# Patient Record
Sex: Male | Born: 1940 | Race: Black or African American | Hispanic: No | Marital: Married
Health system: Southern US, Community
[De-identification: ages and names within clinical notes are randomized; demographics above are authoritative.]

---

## 2008-01-22 ENCOUNTER — Ambulatory Visit: Payer: Self-pay | Admitting: Internal Medicine

## 2009-08-31 ENCOUNTER — Ambulatory Visit: Payer: Self-pay | Admitting: Urology

## 2009-12-31 ENCOUNTER — Ambulatory Visit: Payer: Self-pay | Admitting: Oncology

## 2010-01-27 ENCOUNTER — Ambulatory Visit: Payer: Self-pay | Admitting: Oncology

## 2010-01-31 ENCOUNTER — Ambulatory Visit: Payer: Self-pay | Admitting: Oncology

## 2010-02-06 ENCOUNTER — Ambulatory Visit: Payer: Self-pay | Admitting: Oncology

## 2010-02-08 ENCOUNTER — Ambulatory Visit: Payer: Self-pay | Admitting: Oncology

## 2010-02-20 ENCOUNTER — Ambulatory Visit: Payer: Self-pay | Admitting: General Surgery

## 2010-02-22 ENCOUNTER — Inpatient Hospital Stay: Payer: Self-pay | Admitting: General Surgery

## 2010-03-02 ENCOUNTER — Ambulatory Visit: Payer: Self-pay | Admitting: Oncology

## 2010-04-02 ENCOUNTER — Ambulatory Visit: Payer: Self-pay | Admitting: Oncology

## 2010-05-03 ENCOUNTER — Ambulatory Visit: Payer: Self-pay | Admitting: Oncology

## 2010-06-01 ENCOUNTER — Ambulatory Visit: Payer: Self-pay | Admitting: Oncology

## 2010-07-02 ENCOUNTER — Ambulatory Visit: Payer: Self-pay | Admitting: Oncology

## 2010-07-04 ENCOUNTER — Inpatient Hospital Stay: Payer: Self-pay | Admitting: Oncology

## 2010-07-09 ENCOUNTER — Ambulatory Visit: Payer: Self-pay | Admitting: Oncology

## 2010-08-01 ENCOUNTER — Ambulatory Visit: Payer: Self-pay | Admitting: Oncology

## 2010-09-01 ENCOUNTER — Ambulatory Visit: Payer: Self-pay | Admitting: Oncology

## 2010-09-14 ENCOUNTER — Ambulatory Visit: Payer: Self-pay | Admitting: Unknown Physician Specialty

## 2010-09-14 DIAGNOSIS — I10 Essential (primary) hypertension: Secondary | ICD-10-CM

## 2010-09-19 ENCOUNTER — Ambulatory Visit: Payer: Self-pay | Admitting: Oncology

## 2010-09-21 ENCOUNTER — Ambulatory Visit: Payer: Self-pay | Admitting: Unknown Physician Specialty

## 2010-10-01 ENCOUNTER — Ambulatory Visit: Payer: Self-pay | Admitting: Oncology

## 2010-11-01 ENCOUNTER — Ambulatory Visit: Payer: Self-pay | Admitting: Oncology

## 2010-12-02 ENCOUNTER — Ambulatory Visit: Payer: Self-pay | Admitting: Oncology

## 2011-01-01 ENCOUNTER — Ambulatory Visit: Payer: Self-pay | Admitting: Oncology

## 2011-02-01 ENCOUNTER — Ambulatory Visit: Payer: Self-pay | Admitting: Oncology

## 2011-03-03 ENCOUNTER — Ambulatory Visit: Payer: Self-pay | Admitting: Oncology

## 2011-03-20 ENCOUNTER — Emergency Department: Payer: Self-pay | Admitting: Emergency Medicine

## 2011-04-03 ENCOUNTER — Ambulatory Visit: Payer: Self-pay | Admitting: Oncology

## 2011-04-04 LAB — CBC CANCER CENTER
Basophil #: 0 x10 3/mm (ref 0.0–0.1)
Eosinophil #: 0 x10 3/mm (ref 0.0–0.7)
Lymphocyte #: 0.5 x10 3/mm — ABNORMAL LOW (ref 1.0–3.6)
MCHC: 34.5 g/dL (ref 32.0–36.0)
MCV: 84 fL (ref 80–100)
Monocyte %: 0.8 %
Neutrophil #: 3.7 x10 3/mm (ref 1.4–6.5)
Platelet: 83 x10 3/mm — ABNORMAL LOW (ref 150–440)
RBC: 3.75 10*6/uL — ABNORMAL LOW (ref 4.40–5.90)
RDW: 16.6 % — ABNORMAL HIGH (ref 11.5–14.5)
WBC: 4.2 x10 3/mm (ref 3.8–10.6)

## 2011-04-17 LAB — CBC CANCER CENTER
Basophil #: 0 x10 3/mm (ref 0.0–0.1)
Basophil %: 0.6 %
Eosinophil %: 0.7 %
HGB: 11.6 g/dL — ABNORMAL LOW (ref 13.0–18.0)
Lymphocyte #: 1.5 x10 3/mm (ref 1.0–3.6)
Lymphocyte %: 19.5 %
Monocyte %: 13.1 %
Neutrophil %: 66.1 %
Platelet: 207 x10 3/mm (ref 150–440)
RBC: 3.96 10*6/uL — ABNORMAL LOW (ref 4.40–5.90)
WBC: 7.4 x10 3/mm (ref 3.8–10.6)

## 2011-04-17 LAB — COMPREHENSIVE METABOLIC PANEL
Albumin: 3.6 g/dL (ref 3.4–5.0)
Alkaline Phosphatase: 175 U/L — ABNORMAL HIGH (ref 50–136)
Calcium, Total: 8.7 mg/dL (ref 8.5–10.1)
Creatinine: 0.87 mg/dL (ref 0.60–1.30)
EGFR (African American): >60
EGFR (Non-African Amer.): >60
Glucose: 89 mg/dL (ref 65–99)
Osmolality: 275 (ref 275–301)
Potassium: 3.6 mmol/L (ref 3.5–5.1)
SGOT(AST): 30 U/L (ref 15–37)

## 2011-04-28 ENCOUNTER — Emergency Department: Payer: Self-pay | Admitting: Emergency Medicine

## 2011-05-02 LAB — BASIC METABOLIC PANEL
Anion Gap: 11 (ref 7–16)
Calcium, Total: 9 mg/dL (ref 8.5–10.1)
Chloride: 101 mmol/L (ref 98–107)
Creatinine: 1 mg/dL (ref 0.60–1.30)
Glucose: 173 mg/dL — ABNORMAL HIGH (ref 65–99)
Osmolality: 282 (ref 275–301)
Potassium: 4 mmol/L (ref 3.5–5.1)
Sodium: 139 mmol/L (ref 136–145)

## 2011-05-02 LAB — CBC CANCER CENTER
Basophil #: 0 x10 3/mm (ref 0.0–0.1)
Basophil %: 0.6 %
Eosinophil %: 0 %
HCT: 33.5 % — ABNORMAL LOW (ref 40.0–52.0)
HGB: 11.5 g/dL — ABNORMAL LOW (ref 13.0–18.0)
Lymphocyte #: 0.6 x10 3/mm — ABNORMAL LOW (ref 1.0–3.6)
Lymphocyte %: 40.1 %
MCH: 29.3 pg (ref 26.0–34.0)
MCHC: 34.4 g/dL (ref 32.0–36.0)
MCV: 85 fL (ref 80–100)
Monocyte #: 0.2 x10 3/mm (ref 0.0–0.7)
Platelet: 209 x10 3/mm (ref 150–440)
WBC: 1.4 x10 3/mm — CL (ref 3.8–10.6)

## 2011-05-04 ENCOUNTER — Ambulatory Visit: Payer: Self-pay | Admitting: Oncology

## 2011-05-08 LAB — CBC CANCER CENTER
Bands: 13 %
Basophil #: 0 x10 3/mm (ref 0.0–0.1)
Basophil %: 0.4 %
Basophil: 1 %
Eosinophil %: 0 %
HGB: 11.1 g/dL — ABNORMAL LOW (ref 13.0–18.0)
Lymphocyte %: 21.9 %
Lymphocytes: 16 %
MCHC: 34.1 g/dL (ref 32.0–36.0)
MCV: 85 fL (ref 80–100)
Monocyte #: 1.5 x10 3/mm — ABNORMAL HIGH (ref 0.0–0.7)
Monocytes: 19 %
Neutrophil %: 57.4 %
Platelet: 222 x10 3/mm (ref 150–440)
RBC: 3.86 10*6/uL — ABNORMAL LOW (ref 4.40–5.90)
RDW: 16.1 % — ABNORMAL HIGH (ref 11.5–14.5)
Segmented Neutrophils: 44 %
WBC: 7.2 x10 3/mm (ref 3.8–10.6)

## 2011-05-16 LAB — URINALYSIS, COMPLETE
Bilirubin,UR: NEGATIVE
Blood: NEGATIVE
Glucose,UR: NEGATIVE mg/dL (ref 0–75)
Ph: 5 (ref 4.5–8.0)
Specific Gravity: 1.023 (ref 1.003–1.030)
Squamous Epithelial: <1

## 2011-05-16 LAB — BASIC METABOLIC PANEL
Anion Gap: 8 (ref 7–16)
Creatinine: 1.05 mg/dL (ref 0.60–1.30)
EGFR (Non-African Amer.): >60
Glucose: 106 mg/dL — ABNORMAL HIGH (ref 65–99)
Osmolality: 279 (ref 275–301)
Potassium: 4.1 mmol/L (ref 3.5–5.1)

## 2011-05-16 LAB — CBC CANCER CENTER
Basophil #: 0 x10 3/mm (ref 0.0–0.1)
Basophil %: 0.4 %
Eosinophil #: 0 x10 3/mm (ref 0.0–0.7)
Eosinophil %: 0.4 %
HGB: 11.1 g/dL — ABNORMAL LOW (ref 13.0–18.0)
MCH: 28.5 pg (ref 26.0–34.0)
MCHC: 33.7 g/dL (ref 32.0–36.0)
Monocyte #: 0.8 x10 3/mm — ABNORMAL HIGH (ref 0.0–0.7)
Monocyte %: 8.8 %
Neutrophil %: 76.3 %
Platelet: 224 x10 3/mm (ref 150–440)
RDW: 16.3 % — ABNORMAL HIGH (ref 11.5–14.5)
WBC: 9.6 x10 3/mm (ref 3.8–10.6)

## 2011-05-22 LAB — CULTURE, BLOOD (SINGLE)

## 2011-05-29 LAB — CREATININE, SERUM
EGFR (African American): >60
EGFR (Non-African Amer.): >60

## 2011-05-30 LAB — CBC CANCER CENTER
Basophil #: 0 x10 3/mm (ref 0.0–0.1)
Basophil %: 0.2 %
Eosinophil #: 0.1 x10 3/mm (ref 0.0–0.7)
Eosinophil %: 0.5 %
HCT: 35.2 % — ABNORMAL LOW (ref 40.0–52.0)
HGB: 11.6 g/dL — ABNORMAL LOW (ref 13.0–18.0)
Lymphocyte #: 1.3 x10 3/mm (ref 1.0–3.6)
MCV: 85 fL (ref 80–100)
Monocyte #: 0.6 x10 3/mm (ref 0.0–0.7)
Monocyte %: 4.1 %
Platelet: 233 x10 3/mm (ref 150–440)
RBC: 4.16 10*6/uL — ABNORMAL LOW (ref 4.40–5.90)
WBC: 15.5 x10 3/mm — ABNORMAL HIGH (ref 3.8–10.6)

## 2011-05-30 LAB — COMPREHENSIVE METABOLIC PANEL
Albumin: 3.2 g/dL — ABNORMAL LOW (ref 3.4–5.0)
Alkaline Phosphatase: 223 U/L — ABNORMAL HIGH (ref 50–136)
Anion Gap: 13 (ref 7–16)
BUN: 12 mg/dL (ref 7–18)
Calcium, Total: 9 mg/dL (ref 8.5–10.1)
EGFR (Non-African Amer.): >60
SGOT(AST): 32 U/L (ref 15–37)
SGPT (ALT): 42 U/L
Total Protein: 7.7 g/dL (ref 6.4–8.2)

## 2011-06-01 ENCOUNTER — Ambulatory Visit: Payer: Self-pay | Admitting: Oncology

## 2011-06-04 LAB — CBC CANCER CENTER
Basophil #: 0 x10 3/mm (ref 0.0–0.1)
Basophil %: 0 %
Eosinophil #: 0.3 x10 3/mm (ref 0.0–0.7)
HGB: 11.5 g/dL — ABNORMAL LOW (ref 13.0–18.0)
Lymphocyte #: 1.3 x10 3/mm (ref 1.0–3.6)
Lymphocyte %: 9.7 %
MCH: 28 pg (ref 26.0–34.0)
MCV: 84 fL (ref 80–100)
Monocyte #: 0.5 x10 3/mm (ref 0.0–0.7)
Neutrophil %: 85.1 %
Platelet: 193 x10 3/mm (ref 150–440)
RBC: 4.1 10*6/uL — ABNORMAL LOW (ref 4.40–5.90)
RDW: 16.5 % — ABNORMAL HIGH (ref 11.5–14.5)

## 2011-06-11 LAB — CBC CANCER CENTER
Basophil #: 0 x10 3/mm (ref 0.0–0.1)
Eosinophil #: 0.1 x10 3/mm (ref 0.0–0.7)
Eosinophil %: 1.1 %
Lymphocyte #: 0.8 x10 3/mm — ABNORMAL LOW (ref 1.0–3.6)
Lymphocyte %: 11.8 %
MCV: 83 fL (ref 80–100)
Monocyte #: 0.1 x10 3/mm (ref 0.0–0.7)
Monocyte %: 2.1 %
Neutrophil %: 84.9 %
Platelet: 83 x10 3/mm — ABNORMAL LOW (ref 150–440)
RBC: 3.7 10*6/uL — ABNORMAL LOW (ref 4.40–5.90)
WBC: 6.9 x10 3/mm (ref 3.8–10.6)

## 2011-06-11 LAB — COMPREHENSIVE METABOLIC PANEL
Albumin: 2.8 g/dL — ABNORMAL LOW (ref 3.4–5.0)
Alkaline Phosphatase: 321 U/L — ABNORMAL HIGH (ref 50–136)
BUN: 10 mg/dL (ref 7–18)
Calcium, Total: 8.5 mg/dL (ref 8.5–10.1)
Creatinine: 0.97 mg/dL (ref 0.60–1.30)
EGFR (African American): >60
EGFR (Non-African Amer.): >60
Glucose: 129 mg/dL — ABNORMAL HIGH (ref 65–99)
Potassium: 3.3 mmol/L — ABNORMAL LOW (ref 3.5–5.1)
SGOT(AST): 41 U/L — ABNORMAL HIGH (ref 15–37)
Total Protein: 6.8 g/dL (ref 6.4–8.2)

## 2011-06-18 LAB — BASIC METABOLIC PANEL
Anion Gap: 10 (ref 7–16)
Calcium, Total: 8.4 mg/dL — ABNORMAL LOW (ref 8.5–10.1)
Chloride: 102 mmol/L (ref 98–107)
Co2: 25 mmol/L (ref 21–32)
EGFR (African American): >60
Osmolality: 274 (ref 275–301)

## 2011-06-18 LAB — CBC CANCER CENTER
Basophil #: 0 x10 3/mm (ref 0.0–0.1)
Eosinophil #: 0 x10 3/mm (ref 0.0–0.7)
MCH: 28.5 pg (ref 26.0–34.0)
MCHC: 34.1 g/dL (ref 32.0–36.0)
Monocyte #: 0.7 x10 3/mm (ref 0.0–0.7)
Monocyte %: 17.1 %
Neutrophil %: 63.2 %
Platelet: 236 x10 3/mm (ref 150–440)
RDW: 17.3 % — ABNORMAL HIGH (ref 11.5–14.5)

## 2011-06-25 LAB — CBC CANCER CENTER
Basophil #: 0 x10 3/mm (ref 0.0–0.1)
Basophil %: 0.1 %
Eosinophil #: 0 x10 3/mm (ref 0.0–0.7)
HCT: 29.9 % — ABNORMAL LOW (ref 40.0–52.0)
HGB: 10.1 g/dL — ABNORMAL LOW (ref 13.0–18.0)
Lymphocyte #: 1 x10 3/mm (ref 1.0–3.6)
MCH: 27.7 pg (ref 26.0–34.0)
MCHC: 33.8 g/dL (ref 32.0–36.0)
MCV: 82 fL (ref 80–100)
Monocyte #: 0.2 x10 3/mm (ref 0.0–0.7)
Monocyte %: 2.7 %
Neutrophil %: 80.4 %
RDW: 16.9 % — ABNORMAL HIGH (ref 11.5–14.5)
WBC: 6 x10 3/mm (ref 3.8–10.6)

## 2011-06-25 LAB — COMPREHENSIVE METABOLIC PANEL
Alkaline Phosphatase: 308 U/L — ABNORMAL HIGH (ref 50–136)
BUN: 10 mg/dL (ref 7–18)
Bilirubin,Total: 0.9 mg/dL (ref 0.2–1.0)
Calcium, Total: 8.7 mg/dL (ref 8.5–10.1)
Chloride: 102 mmol/L (ref 98–107)
EGFR (Non-African Amer.): >60
SGOT(AST): 42 U/L — ABNORMAL HIGH (ref 15–37)
Sodium: 141 mmol/L (ref 136–145)
Total Protein: 6.6 g/dL (ref 6.4–8.2)

## 2011-06-25 LAB — MAGNESIUM: Magnesium: 1.4 mg/dL — ABNORMAL LOW

## 2011-07-02 ENCOUNTER — Ambulatory Visit: Payer: Self-pay | Admitting: Oncology

## 2011-07-02 LAB — BASIC METABOLIC PANEL
BUN: 15 mg/dL (ref 7–18)
Calcium, Total: 8.3 mg/dL — ABNORMAL LOW (ref 8.5–10.1)
Co2: 27 mmol/L (ref 21–32)
Creatinine: 0.98 mg/dL (ref 0.60–1.30)
EGFR (African American): >60
EGFR (Non-African Amer.): >60
Osmolality: 284 (ref 275–301)
Potassium: 3.6 mmol/L (ref 3.5–5.1)

## 2011-07-02 LAB — CBC CANCER CENTER
Basophil #: 0 x10 3/mm (ref 0.0–0.1)
HCT: 32.1 % — ABNORMAL LOW (ref 40.0–52.0)
Lymphocyte %: 12.9 %
MCHC: 33.5 g/dL (ref 32.0–36.0)
MCV: 84 fL (ref 80–100)
Monocyte %: 11.6 %
Neutrophil #: 4.3 x10 3/mm (ref 1.4–6.5)
Platelet: 283 x10 3/mm (ref 150–440)
RBC: 3.82 10*6/uL — ABNORMAL LOW (ref 4.40–5.90)
WBC: 5.7 x10 3/mm (ref 3.8–10.6)

## 2011-07-09 LAB — COMPREHENSIVE METABOLIC PANEL
Anion Gap: 14 (ref 7–16)
BUN: 11 mg/dL (ref 7–18)
Bilirubin,Total: 1.8 mg/dL — ABNORMAL HIGH (ref 0.2–1.0)
EGFR (African American): >60
Glucose: 130 mg/dL — ABNORMAL HIGH (ref 65–99)
Potassium: 3.3 mmol/L — ABNORMAL LOW (ref 3.5–5.1)
SGOT(AST): 59 U/L — ABNORMAL HIGH (ref 15–37)

## 2011-07-09 LAB — CBC CANCER CENTER
Basophil %: 0.1 %
HCT: 29.9 % — ABNORMAL LOW (ref 40.0–52.0)
HGB: 10.1 g/dL — ABNORMAL LOW (ref 13.0–18.0)
Lymphocyte #: 0.7 x10 3/mm — ABNORMAL LOW (ref 1.0–3.6)
Lymphocyte %: 7.1 %
MCV: 83 fL (ref 80–100)
Monocyte #: 0.2 x10 3/mm (ref 0.0–0.7)
Monocyte %: 1.8 %
Neutrophil #: 9.5 x10 3/mm — ABNORMAL HIGH (ref 1.4–6.5)
Neutrophil %: 90.8 %
RDW: 19.5 % — ABNORMAL HIGH (ref 11.5–14.5)

## 2011-07-17 LAB — COMPREHENSIVE METABOLIC PANEL
Anion Gap: 9 (ref 7–16)
BUN: 11 mg/dL (ref 7–18)
Bilirubin,Total: 1.1 mg/dL — ABNORMAL HIGH (ref 0.2–1.0)
Calcium, Total: 8.5 mg/dL (ref 8.5–10.1)
Creatinine: 0.83 mg/dL (ref 0.60–1.30)
EGFR (African American): >60
Osmolality: 281 (ref 275–301)
Potassium: 3.4 mmol/L — ABNORMAL LOW (ref 3.5–5.1)
SGPT (ALT): 88 U/L — ABNORMAL HIGH
Total Protein: 6.1 g/dL — ABNORMAL LOW (ref 6.4–8.2)

## 2011-07-17 LAB — CBC CANCER CENTER
Bands: 23 %
Basophil #: 0 x10 3/mm (ref 0.0–0.1)
Basophil %: 0.6 %
Comment - H1-Com4: NORMAL
Eosinophil #: 0 x10 3/mm (ref 0.0–0.7)
Eosinophil %: 1.2 %
Eosinophil: 1 %
HCT: 27.8 % — ABNORMAL LOW (ref 40.0–52.0)
HGB: 9.2 g/dL — ABNORMAL LOW (ref 13.0–18.0)
Lymphocyte %: 35.3 %
Lymphocytes: 40 %
MCH: 28.1 pg (ref 26.0–34.0)
Monocyte %: 15.6 %
Monocytes: 3 %
Myelocyte: 3 %
Neutrophil #: 0.9 x10 3/mm — ABNORMAL LOW (ref 1.4–6.5)
Neutrophil %: 47.3 %
Platelet: 133 x10 3/mm — ABNORMAL LOW (ref 150–440)
RBC: 3.28 10*6/uL — ABNORMAL LOW (ref 4.40–5.90)
RDW: 19.7 % — ABNORMAL HIGH (ref 11.5–14.5)
WBC: 2 x10 3/mm — CL (ref 3.8–10.6)

## 2011-07-24 LAB — CBC CANCER CENTER
Basophil #: 0.1 x10 3/mm (ref 0.0–0.1)
Basophil %: 0.6 %
Eosinophil %: 0.1 %
HCT: 32.7 % — ABNORMAL LOW (ref 40.0–52.0)
Lymphocyte #: 1.3 x10 3/mm (ref 1.0–3.6)
Lymphocyte %: 9.5 %
MCHC: 31.8 g/dL — ABNORMAL LOW (ref 32.0–36.0)
Monocyte %: 14.6 %
Neutrophil %: 75.2 %
Platelet: 366 x10 3/mm (ref 150–440)
RBC: 3.79 10*6/uL — ABNORMAL LOW (ref 4.40–5.90)

## 2011-07-24 LAB — COMPREHENSIVE METABOLIC PANEL
Alkaline Phosphatase: 316 U/L — ABNORMAL HIGH (ref 50–136)
Anion Gap: 11 (ref 7–16)
BUN: 12 mg/dL (ref 7–18)
Bilirubin,Total: 0.9 mg/dL (ref 0.2–1.0)
Calcium, Total: 8.6 mg/dL (ref 8.5–10.1)
Creatinine: 0.95 mg/dL (ref 0.60–1.30)
EGFR (African American): >60
Glucose: 125 mg/dL — ABNORMAL HIGH (ref 65–99)
Osmolality: 277 (ref 275–301)
Potassium: 3.5 mmol/L (ref 3.5–5.1)
SGPT (ALT): 53 U/L
Sodium: 138 mmol/L (ref 136–145)
Total Protein: 6.5 g/dL (ref 6.4–8.2)

## 2011-08-01 ENCOUNTER — Ambulatory Visit: Payer: Self-pay | Admitting: Oncology

## 2011-08-21 LAB — COMPREHENSIVE METABOLIC PANEL WITH GFR
Albumin: 2.9 g/dL — ABNORMAL LOW
Alkaline Phosphatase: 542 U/L — ABNORMAL HIGH
Anion Gap: 9
BUN: 6 mg/dL — ABNORMAL LOW
Bilirubin,Total: 1.3 mg/dL — ABNORMAL HIGH
Calcium, Total: 9 mg/dL
Chloride: 104 mmol/L
Co2: 26 mmol/L
Creatinine: 0.89 mg/dL
EGFR (African American): >60
EGFR (Non-African Amer.): >60
Glucose: 114 mg/dL — ABNORMAL HIGH
Osmolality: 276
Potassium: 4 mmol/L
SGOT(AST): 90 U/L — ABNORMAL HIGH
SGPT (ALT): 74 U/L
Sodium: 139 mmol/L
Total Protein: 6.7 g/dL

## 2011-08-21 LAB — CBC CANCER CENTER
Basophil #: 0.1 x10 3/mm (ref 0.0–0.1)
Eosinophil #: 0.1 x10 3/mm (ref 0.0–0.7)
HCT: 35 % — ABNORMAL LOW (ref 40.0–52.0)
HGB: 11 g/dL — ABNORMAL LOW (ref 13.0–18.0)
Lymphocyte #: 1 x10 3/mm (ref 1.0–3.6)
MCH: 28.1 pg (ref 26.0–34.0)
Neutrophil #: 9.3 x10 3/mm — ABNORMAL HIGH (ref 1.4–6.5)
Platelet: 208 x10 3/mm (ref 150–440)
RBC: 3.92 10*6/uL — ABNORMAL LOW (ref 4.40–5.90)
WBC: 11.2 x10 3/mm — ABNORMAL HIGH (ref 3.8–10.6)

## 2011-08-29 LAB — CBC CANCER CENTER
Basophil #: 0 x10 3/mm (ref 0.0–0.1)
Basophil %: 0.7 %
Eosinophil %: 1.3 %
HGB: 9.5 g/dL — ABNORMAL LOW (ref 13.0–18.0)
Lymphocyte #: 1.2 x10 3/mm (ref 1.0–3.6)
MCH: 28.7 pg (ref 26.0–34.0)
MCHC: 32.4 g/dL (ref 32.0–36.0)
MCV: 89 fL (ref 80–100)
Monocyte #: 0.7 x10 3/mm (ref 0.2–1.0)
Monocyte %: 13.9 %
Neutrophil #: 3 x10 3/mm (ref 1.4–6.5)
Neutrophil %: 60.7 %
Platelet: 213 x10 3/mm (ref 150–440)

## 2011-08-29 LAB — COMPREHENSIVE METABOLIC PANEL
Albumin: 2.8 g/dL — ABNORMAL LOW (ref 3.4–5.0)
Alkaline Phosphatase: 391 U/L — ABNORMAL HIGH (ref 50–136)
Anion Gap: 11 (ref 7–16)
Bilirubin,Total: 2.6 mg/dL — ABNORMAL HIGH (ref 0.2–1.0)
Calcium, Total: 8.9 mg/dL (ref 8.5–10.1)
Chloride: 102 mmol/L (ref 98–107)
Co2: 27 mmol/L (ref 21–32)
Creatinine: 0.84 mg/dL (ref 0.60–1.30)
EGFR (African American): >60
EGFR (Non-African Amer.): >60
Glucose: 110 mg/dL — ABNORMAL HIGH (ref 65–99)
Osmolality: 278 (ref 275–301)
Potassium: 3.3 mmol/L — ABNORMAL LOW (ref 3.5–5.1)
Sodium: 140 mmol/L (ref 136–145)

## 2011-09-01 ENCOUNTER — Ambulatory Visit: Payer: Self-pay | Admitting: Oncology

## 2011-09-05 LAB — CBC CANCER CENTER
Basophil #: 0 x10 3/mm (ref 0.0–0.1)
Basophil %: 1.9 %
Eosinophil #: 0 x10 3/mm (ref 0.0–0.7)
Eosinophil %: 0.3 %
HCT: 25.7 % — ABNORMAL LOW (ref 40.0–52.0)
HGB: 8.6 g/dL — ABNORMAL LOW (ref 13.0–18.0)
Lymphocyte #: 0.4 x10 3/mm — ABNORMAL LOW (ref 1.0–3.6)
Lymphocyte %: 46.3 %
MCH: 28.6 pg (ref 26.0–34.0)
MCV: 86 fL (ref 80–100)
Monocyte %: 21.4 %
Neutrophil %: 30.1 %
Platelet: 195 x10 3/mm (ref 150–440)
RBC: 3 10*6/uL — ABNORMAL LOW (ref 4.40–5.90)
RDW: 16.2 % — ABNORMAL HIGH (ref 11.5–14.5)

## 2011-09-05 LAB — COMPREHENSIVE METABOLIC PANEL
Alkaline Phosphatase: 391 U/L — ABNORMAL HIGH (ref 50–136)
BUN: 10 mg/dL (ref 7–18)
Calcium, Total: 8.8 mg/dL (ref 8.5–10.1)
Co2: 32 mmol/L (ref 21–32)
Creatinine: 1.08 mg/dL (ref 0.60–1.30)
EGFR (African American): >60
EGFR (Non-African Amer.): >60
Glucose: 161 mg/dL — ABNORMAL HIGH (ref 65–99)
Osmolality: 269 (ref 275–301)
SGOT(AST): 44 U/L — ABNORMAL HIGH (ref 15–37)
Sodium: 133 mmol/L — ABNORMAL LOW (ref 136–145)
Total Protein: 6.5 g/dL (ref 6.4–8.2)

## 2011-09-06 LAB — URINALYSIS, COMPLETE
Bacteria: NONE SEEN
Ketone: NEGATIVE
Leukocyte Esterase: NEGATIVE
Nitrite: NEGATIVE
Ph: 7 (ref 4.5–8.0)
Protein: NEGATIVE
RBC,UR: 251 /HPF (ref 0–5)

## 2011-09-07 LAB — URINE CULTURE

## 2011-09-11 LAB — URINALYSIS, COMPLETE
Hyaline Cast: 10
Ketone: NEGATIVE
Nitrite: NEGATIVE
Ph: 6 (ref 4.5–8.0)
Protein: 100
RBC,UR: 874 /HPF (ref 0–5)
Specific Gravity: 1.017 (ref 1.003–1.030)
Squamous Epithelial: NONE SEEN
WBC UR: 44 /HPF (ref 0–5)

## 2011-09-11 LAB — HEPATIC FUNCTION PANEL A (ARMC)
Albumin: 2.3 g/dL — ABNORMAL LOW (ref 3.4–5.0)
Alkaline Phosphatase: 577 U/L — ABNORMAL HIGH (ref 50–136)
SGPT (ALT): 54 U/L
Total Protein: 5.8 g/dL — ABNORMAL LOW (ref 6.4–8.2)

## 2011-09-11 LAB — COMPREHENSIVE METABOLIC PANEL
Albumin: 2.3 g/dL — ABNORMAL LOW (ref 3.4–5.0)
Alkaline Phosphatase: 594 U/L — ABNORMAL HIGH (ref 50–136)
Anion Gap: 10 (ref 7–16)
BUN: 15 mg/dL (ref 7–18)
Chloride: 94 mmol/L — ABNORMAL LOW (ref 98–107)
Creatinine: 0.92 mg/dL (ref 0.60–1.30)
EGFR (African American): >60
EGFR (Non-African Amer.): >60
Potassium: 2.7 mmol/L — ABNORMAL LOW (ref 3.5–5.1)
SGOT(AST): 88 U/L — ABNORMAL HIGH (ref 15–37)
Sodium: 135 mmol/L — ABNORMAL LOW (ref 136–145)

## 2011-09-11 LAB — CBC CANCER CENTER
Bands: 8 %
HCT: 31.2 % — ABNORMAL LOW (ref 40.0–52.0)
HGB: 10.2 g/dL — ABNORMAL LOW (ref 13.0–18.0)
Lymphocytes: 4 %
MCH: 28.2 pg (ref 26.0–34.0)
MCHC: 32.8 g/dL (ref 32.0–36.0)
Metamyelocyte: 2 %
Platelet: 128 x10 3/mm — ABNORMAL LOW (ref 150–440)
RBC: 3.62 10*6/uL — ABNORMAL LOW (ref 4.40–5.90)
Segmented Neutrophils: 73 %
WBC: 22.6 x10 3/mm — ABNORMAL HIGH (ref 3.8–10.6)

## 2011-09-11 LAB — CULTURE, BLOOD (SINGLE)

## 2011-09-12 LAB — URINE CULTURE

## 2011-09-19 LAB — COMPREHENSIVE METABOLIC PANEL
Alkaline Phosphatase: 562 U/L — ABNORMAL HIGH (ref 50–136)
Anion Gap: 8 (ref 7–16)
BUN: 8 mg/dL (ref 7–18)
Bilirubin,Total: 3.7 mg/dL — ABNORMAL HIGH (ref 0.2–1.0)
Chloride: 102 mmol/L (ref 98–107)
Co2: 28 mmol/L (ref 21–32)
Creatinine: 0.77 mg/dL (ref 0.60–1.30)
EGFR (African American): >60
Glucose: 109 mg/dL — ABNORMAL HIGH (ref 65–99)
Osmolality: 275 (ref 275–301)
SGPT (ALT): 52 U/L
Total Protein: 6 g/dL — ABNORMAL LOW (ref 6.4–8.2)

## 2011-09-19 LAB — CBC CANCER CENTER
Basophil #: 0.2 x10 3/mm — ABNORMAL HIGH (ref 0.0–0.1)
Basophil %: 1 %
Eosinophil #: 0 x10 3/mm (ref 0.0–0.7)
HCT: 29.8 % — ABNORMAL LOW (ref 40.0–52.0)
HGB: 9.5 g/dL — ABNORMAL LOW (ref 13.0–18.0)
Lymphocyte #: 1.4 x10 3/mm (ref 1.0–3.6)
Lymphocyte %: 8.9 %
MCH: 29 pg (ref 26.0–34.0)
MCHC: 32 g/dL (ref 32.0–36.0)
MCV: 91 fL (ref 80–100)
Monocyte %: 6.5 %
Neutrophil #: 13.5 x10 3/mm — ABNORMAL HIGH (ref 1.4–6.5)
Neutrophil %: 83.4 %
Platelet: 124 x10 3/mm — ABNORMAL LOW (ref 150–440)
RBC: 3.29 10*6/uL — ABNORMAL LOW (ref 4.40–5.90)
RDW: 20.6 % — ABNORMAL HIGH (ref 11.5–14.5)

## 2011-09-26 LAB — COMPREHENSIVE METABOLIC PANEL
Alkaline Phosphatase: 755 U/L — ABNORMAL HIGH (ref 50–136)
Calcium, Total: 8.9 mg/dL (ref 8.5–10.1)
Chloride: 100 mmol/L (ref 98–107)
Co2: 28 mmol/L (ref 21–32)
EGFR (African American): >60
Osmolality: 276 (ref 275–301)
Potassium: 3.6 mmol/L (ref 3.5–5.1)
SGOT(AST): 99 U/L — ABNORMAL HIGH (ref 15–37)
SGPT (ALT): 94 U/L — ABNORMAL HIGH
Sodium: 138 mmol/L (ref 136–145)
Total Protein: 6.7 g/dL (ref 6.4–8.2)

## 2011-09-26 LAB — CBC CANCER CENTER
Basophil #: 0 x10 3/mm (ref 0.0–0.1)
Basophil %: 0.2 %
Eosinophil %: 0 %
Lymphocyte %: 6 %
Monocyte #: 0.4 x10 3/mm (ref 0.2–1.0)
Platelet: 216 x10 3/mm (ref 150–440)
RBC: 3.57 10*6/uL — ABNORMAL LOW (ref 4.40–5.90)
RDW: 20.9 % — ABNORMAL HIGH (ref 11.5–14.5)
WBC: 13.7 x10 3/mm — ABNORMAL HIGH (ref 3.8–10.6)

## 2011-10-01 ENCOUNTER — Ambulatory Visit: Payer: Self-pay | Admitting: Oncology

## 2011-10-03 LAB — CBC CANCER CENTER
Basophil #: 0 x10 3/mm (ref 0.0–0.1)
Eosinophil #: 0 x10 3/mm (ref 0.0–0.7)
HCT: 27.1 % — ABNORMAL LOW (ref 40.0–52.0)
HGB: 9 g/dL — ABNORMAL LOW (ref 13.0–18.0)
Lymphocyte %: 14.4 %
MCH: 30.2 pg (ref 26.0–34.0)
MCHC: 33.1 g/dL (ref 32.0–36.0)
Neutrophil #: 3.1 x10 3/mm (ref 1.4–6.5)
Neutrophil %: 80.7 %
Platelet: 158 x10 3/mm (ref 150–440)
RDW: 19.7 % — ABNORMAL HIGH (ref 11.5–14.5)
WBC: 3.9 x10 3/mm (ref 3.8–10.6)

## 2011-10-03 LAB — COMPREHENSIVE METABOLIC PANEL
Albumin: 2.6 g/dL — ABNORMAL LOW (ref 3.4–5.0)
Alkaline Phosphatase: 682 U/L — ABNORMAL HIGH (ref 50–136)
Anion Gap: 10 (ref 7–16)
BUN: 11 mg/dL (ref 7–18)
Bilirubin,Total: 2.4 mg/dL — ABNORMAL HIGH (ref 0.2–1.0)
Calcium, Total: 8.8 mg/dL (ref 8.5–10.1)
Creatinine: 0.72 mg/dL (ref 0.60–1.30)
EGFR (African American): >60
Potassium: 3.4 mmol/L — ABNORMAL LOW (ref 3.5–5.1)
SGOT(AST): 78 U/L — ABNORMAL HIGH (ref 15–37)
SGPT (ALT): 82 U/L — ABNORMAL HIGH
Sodium: 134 mmol/L — ABNORMAL LOW (ref 136–145)
Total Protein: 6.4 g/dL (ref 6.4–8.2)

## 2011-10-10 LAB — COMPREHENSIVE METABOLIC PANEL
Albumin: 2.8 g/dL — ABNORMAL LOW (ref 3.4–5.0)
Alkaline Phosphatase: 663 U/L — ABNORMAL HIGH (ref 50–136)
Anion Gap: 10 (ref 7–16)
BUN: 11 mg/dL (ref 7–18)
Calcium, Total: 9 mg/dL (ref 8.5–10.1)
Chloride: 98 mmol/L (ref 98–107)
EGFR (Non-African Amer.): >60
Osmolality: 270 (ref 275–301)
Potassium: 3.3 mmol/L — ABNORMAL LOW (ref 3.5–5.1)
SGOT(AST): 69 U/L — ABNORMAL HIGH (ref 15–37)

## 2011-10-10 LAB — CBC CANCER CENTER
Basophil #: 0 x10 3/mm (ref 0.0–0.1)
Basophil %: 1.2 %
Eosinophil %: 0 %
Lymphocyte #: 1 x10 3/mm (ref 1.0–3.6)
Lymphocyte %: 32.8 %
MCH: 30.1 pg (ref 26.0–34.0)
MCHC: 32.6 g/dL (ref 32.0–36.0)
MCV: 92 fL (ref 80–100)
Monocyte %: 8.9 %
Neutrophil #: 1.7 x10 3/mm (ref 1.4–6.5)
Neutrophil %: 57.1 %

## 2011-10-31 LAB — HEPATIC FUNCTION PANEL A (ARMC)
Alkaline Phosphatase: 761 U/L — ABNORMAL HIGH (ref 50–136)
Bilirubin, Direct: 1.4 mg/dL — ABNORMAL HIGH (ref 0.00–0.20)
Bilirubin,Total: 1.7 mg/dL — ABNORMAL HIGH (ref 0.2–1.0)
SGOT(AST): 76 U/L — ABNORMAL HIGH (ref 15–37)

## 2011-11-01 ENCOUNTER — Ambulatory Visit: Payer: Self-pay | Admitting: Oncology

## 2011-12-02 ENCOUNTER — Ambulatory Visit: Payer: Self-pay | Admitting: Oncology

## 2011-12-03 ENCOUNTER — Inpatient Hospital Stay: Payer: Self-pay | Admitting: Internal Medicine

## 2011-12-03 LAB — COMPREHENSIVE METABOLIC PANEL
Alkaline Phosphatase: 618 U/L — ABNORMAL HIGH (ref 50–136)
Anion Gap: 6 — ABNORMAL LOW (ref 7–16)
BUN: 8 mg/dL (ref 7–18)
Co2: 31 mmol/L (ref 21–32)
Creatinine: 0.84 mg/dL (ref 0.60–1.30)
EGFR (Non-African Amer.): >60
Glucose: 133 mg/dL — ABNORMAL HIGH (ref 65–99)
Osmolality: 276 (ref 275–301)
Potassium: 3.2 mmol/L — ABNORMAL LOW (ref 3.5–5.1)
SGPT (ALT): 26 U/L (ref 12–78)
Sodium: 138 mmol/L (ref 136–145)

## 2011-12-03 LAB — URINALYSIS, COMPLETE
Bacteria: NONE SEEN
Leukocyte Esterase: NEGATIVE
Ph: 7 (ref 4.5–8.0)
Protein: NEGATIVE
RBC,UR: 6 /HPF (ref 0–5)

## 2011-12-03 LAB — CBC WITH DIFFERENTIAL/PLATELET
Basophil #: 0.1 10*3/uL (ref 0.0–0.1)
Basophil %: 0.6 %
Eosinophil %: 0.3 %
HGB: 9.3 g/dL — ABNORMAL LOW (ref 13.0–18.0)
Lymphocyte #: 1.2 10*3/uL (ref 1.0–3.6)
MCH: 29.6 pg (ref 26.0–34.0)
MCV: 90 fL (ref 80–100)
Monocyte #: 1.5 x10 3/mm — ABNORMAL HIGH (ref 0.2–1.0)
Monocyte %: 7.5 %
Neutrophil %: 85.7 %
RBC: 3.13 10*6/uL — ABNORMAL LOW (ref 4.40–5.90)
WBC: 20.3 10*3/uL — ABNORMAL HIGH (ref 3.8–10.6)

## 2011-12-04 LAB — CBC WITH DIFFERENTIAL/PLATELET
Eosinophil %: 1.5 %
HGB: 8.7 g/dL — ABNORMAL LOW (ref 13.0–18.0)
Lymphocyte #: 1.6 10*3/uL (ref 1.0–3.6)
MCH: 31 pg (ref 26.0–34.0)
Monocyte %: 6.9 %
Neutrophil #: 12.5 10*3/uL — ABNORMAL HIGH (ref 1.4–6.5)
RBC: 2.8 10*6/uL — ABNORMAL LOW (ref 4.40–5.90)
WBC: 15.5 10*3/uL — ABNORMAL HIGH (ref 3.8–10.6)

## 2011-12-04 LAB — BASIC METABOLIC PANEL
Anion Gap: 6 — ABNORMAL LOW (ref 7–16)
BUN: 7 mg/dL (ref 7–18)
Calcium, Total: 8.7 mg/dL (ref 8.5–10.1)
EGFR (African American): >60
EGFR (Non-African Amer.): >60
Glucose: 102 mg/dL — ABNORMAL HIGH (ref 65–99)
Osmolality: 278 (ref 275–301)
Potassium: 3.1 mmol/L — ABNORMAL LOW (ref 3.5–5.1)
Sodium: 140 mmol/L (ref 136–145)

## 2011-12-05 LAB — URINE CULTURE

## 2011-12-09 LAB — CULTURE, BLOOD (SINGLE)

## 2012-01-01 ENCOUNTER — Ambulatory Visit: Payer: Self-pay | Admitting: Oncology

## 2012-01-01 DEATH — deceased

## 2012-03-03 IMAGING — CT CT HEAD WITHOUT AND WITH CONTRAST
1 of 2 series · 13 of 30 positions shown, 17 images · non-contrast
Comparison: none

REASON FOR EXAM: lung Ca   dizziness PT IS ALLERGIC TO CONTRAST DYE
OFFICE TO PREMEDICATE
COMMENTS:

[Series 2: without · axial · non-contrast · 0.40mm/px · z∈[-186,-52]mm · 13 of 33 slices shown, 17 images]
[im 3/33  brain]
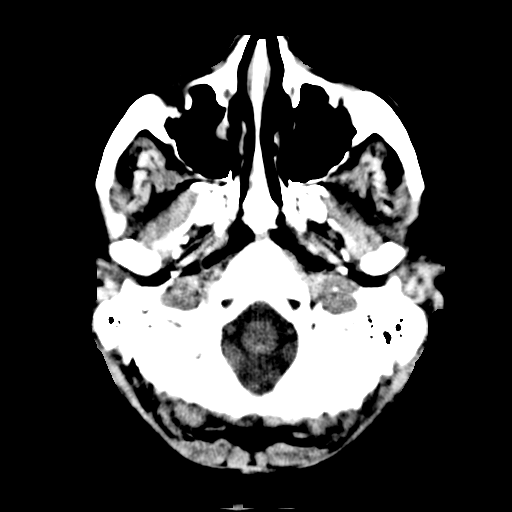
[im 3/33  bone]
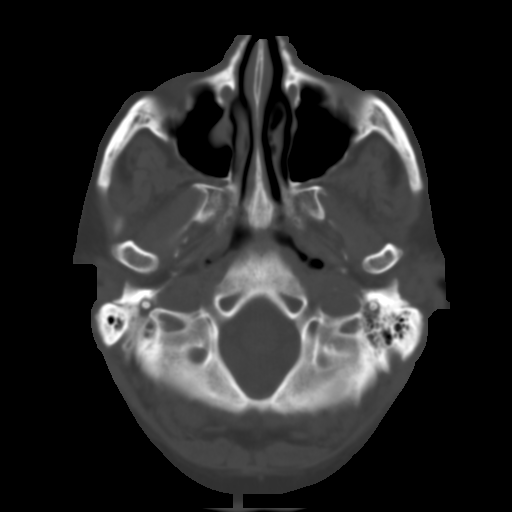
[im 5/33  brain]
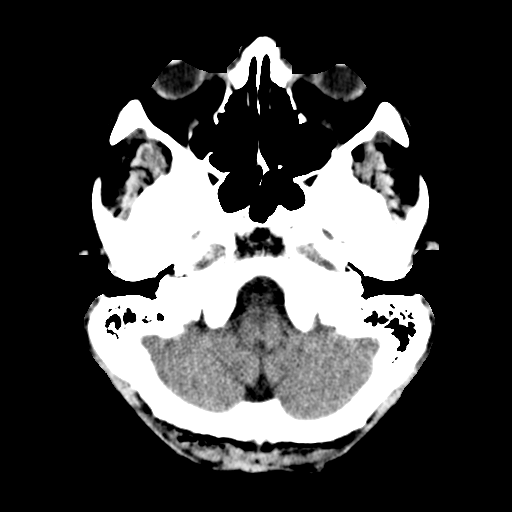
[im 7/33  brain]
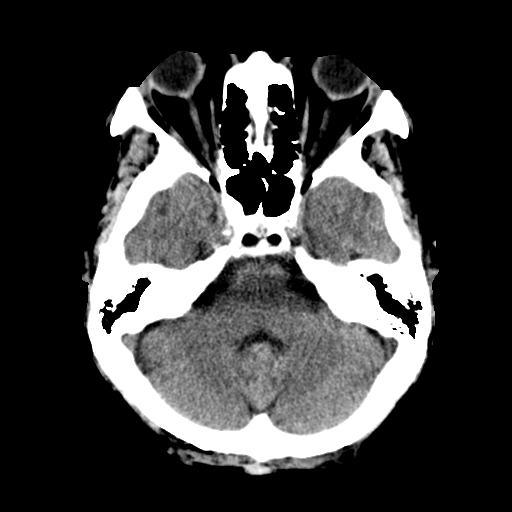
[im 10/33  brain]
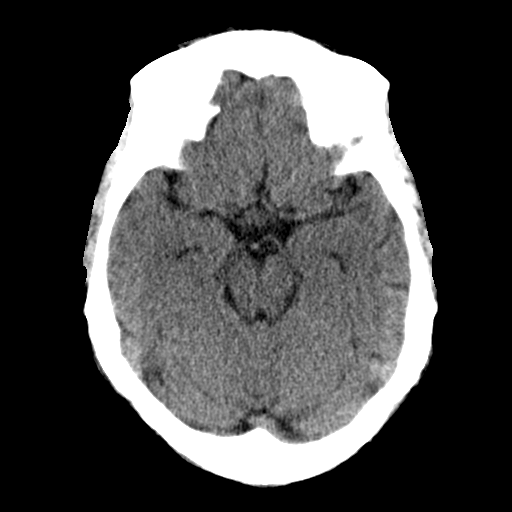
[im 12/33  brain]
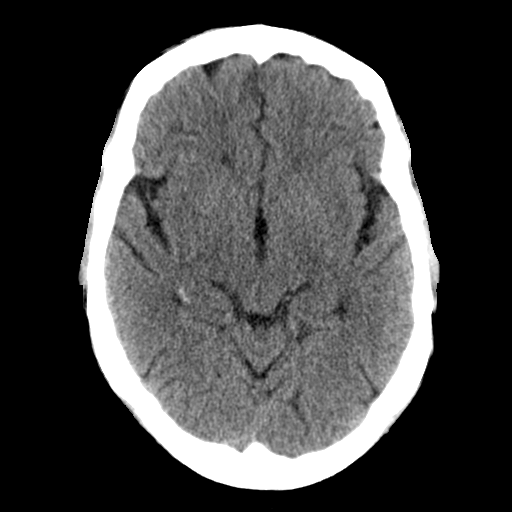
[im 12/33  bone]
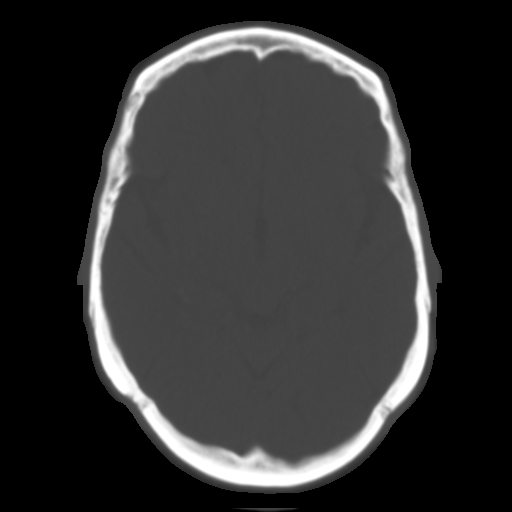
[im 14/33  brain]
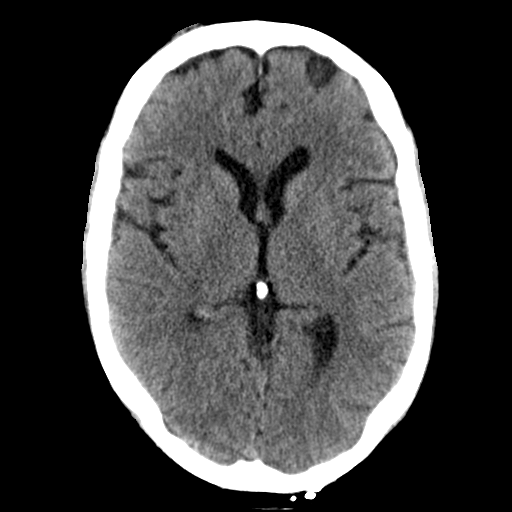
[im 17/33  brain]
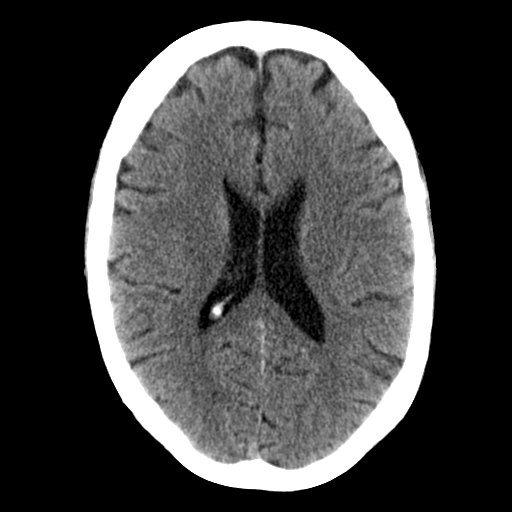
[im 19/33  brain]
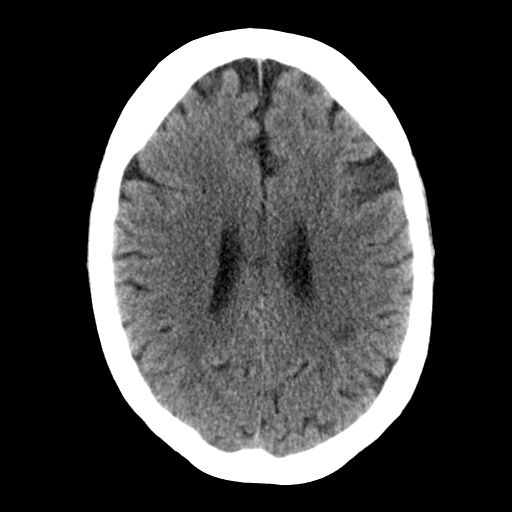
[im 21/33  brain]
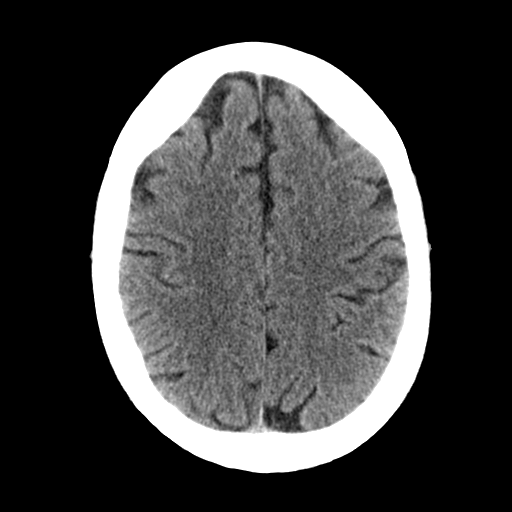
[im 21/33  bone]
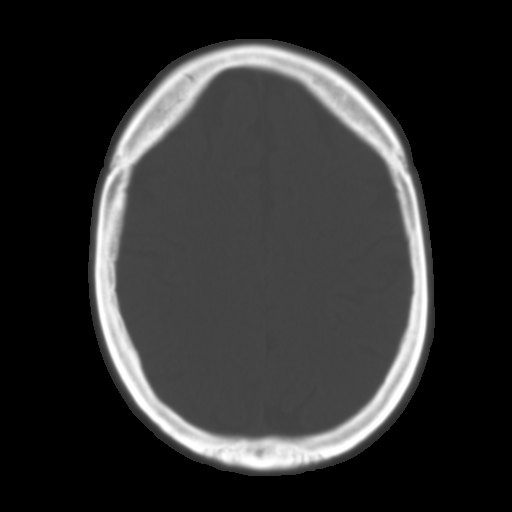
[im 23/33  brain]
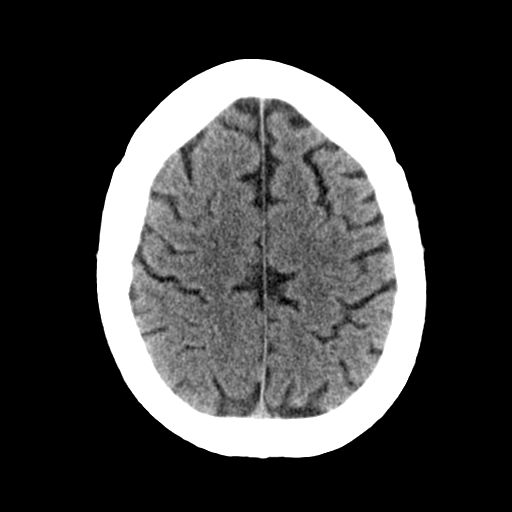
[im 26/33  brain]
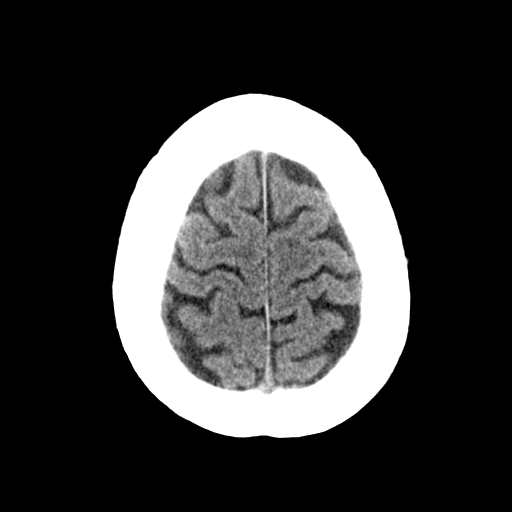
[im 28/33  brain]
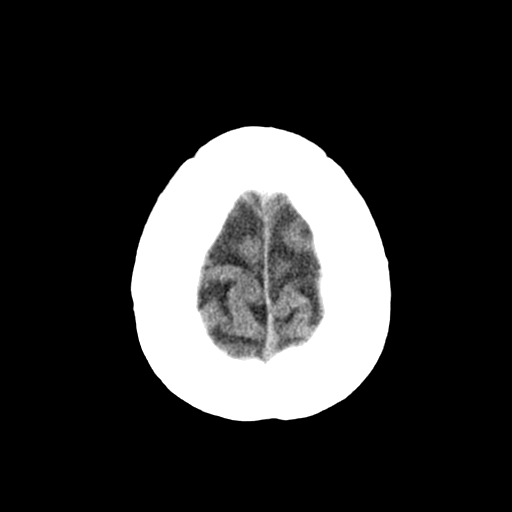
[im 30/33  brain]
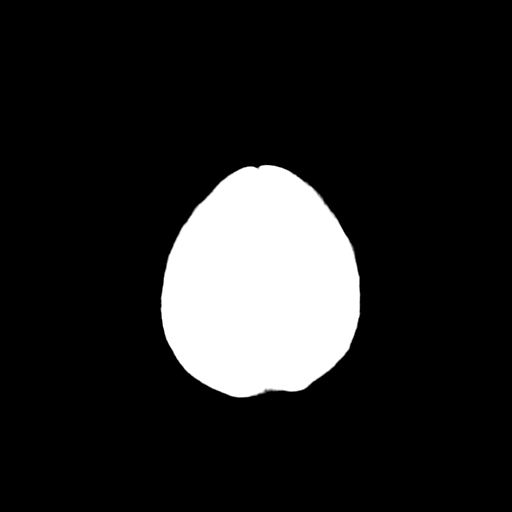
[im 30/33  bone]
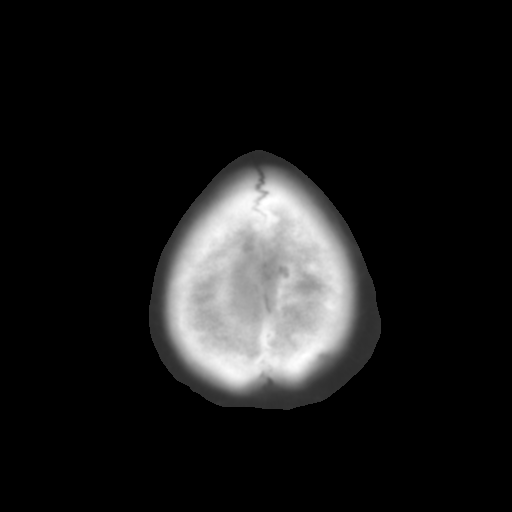

[13 of 30 positions shown; findings below may reference images not displayed]

PROCEDURE:     CT  - CT HEAD W/WO  - May 31, 2011  [DATE]

RESULT:     CT of the brain prior to contrast demonstrates mild prominence
of the ventricles and sulci which is most likely secondary to mild atrophy.
There is a focal area of rounded density medially in the right maxillary
sinus measuring 1.5 cm consistent with a polyp or mucous retention cyst.
There is no evidence of intracranial hemorrhage, mass effect or midline
shift. No territorial infarct is evident.

Images obtained following intravenous administration of 50 mL of Wsovue-2MM
iodinated intravenous contrast demonstrate no abnormal enhancement to
suggest an underlying metastatic focus. The calvarium is intact. The sinuses
are otherwise clear. The mastoid air cells show normal appearing aeration.
IMPRESSION: 1. Mild atrophy.
2. No evidence of an acute intracranial abnormality or metastatic disease.
If the patient is symptomatic, consideration for MRI follow-up would be
recommended as this is more sensitive to early metastatic changes.
3. The patient had no documented allergic reaction to the intravenous
contrast administration. The patient was premedicated because of reported
history of contrast sensitivity.

## 2012-04-18 IMAGING — CT CT CHEST-ABD W/ CM
1 of 2 series · 13 of 29 positions shown, 18 images · non-contrast
Comparison: none

REASON FOR EXAM: Restaging Lung CA Allergic Iodine Will pick up prep
COMMENTS:

PROCEDURE:     CT  - CT CHEST AND ABDOMEN W  - July 16, 2011  [DATE]
RESULT:
TECHNIQUE: CT of the chest and abdomen is performed with 100 ml of
Usovue-OLL iodinated intravenous contrast and oral contrast with images
reconstructed at 5.0 mm slice thickness and compared to the previous
examination dated 21 May, 2011. There is also a previous examination
dated 04 April, 2011 for comparison.

[Series 2: soft tissue · axial · 0.68mm/px · z∈[+218,+618]mm · 13 of 94 slices shown, 18 images]
[im 7/94  mediastinal]
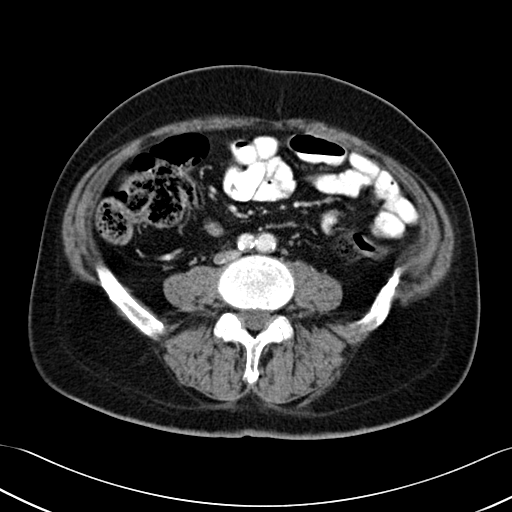
[im 7/94  bone]
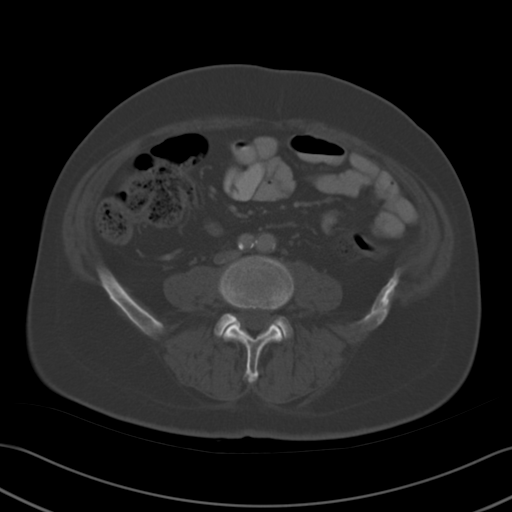
[im 13/94  mediastinal]
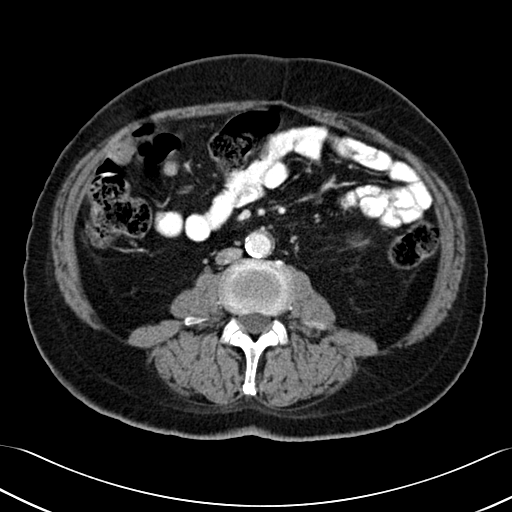
[im 25/94  mediastinal]
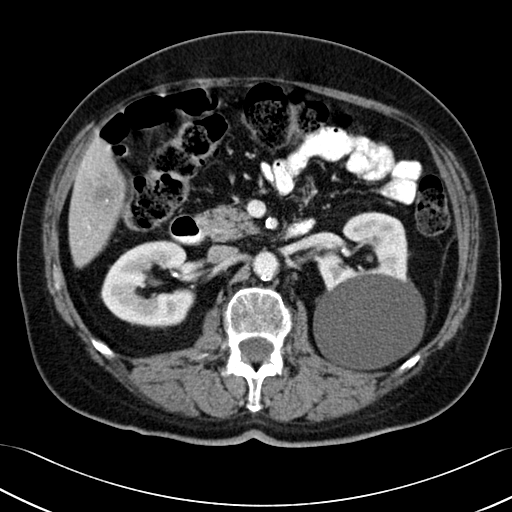
[im 32/94  mediastinal]
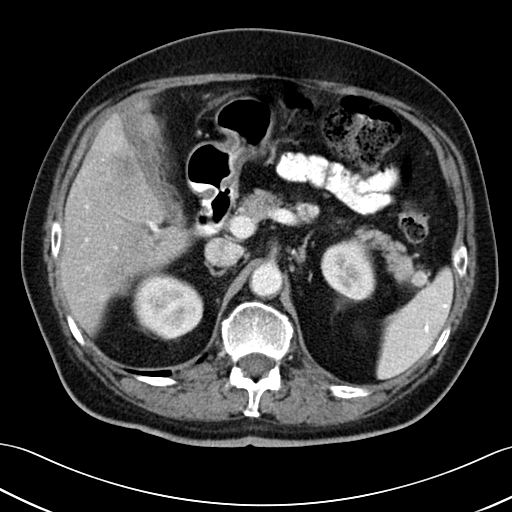
[im 38/94  mediastinal]
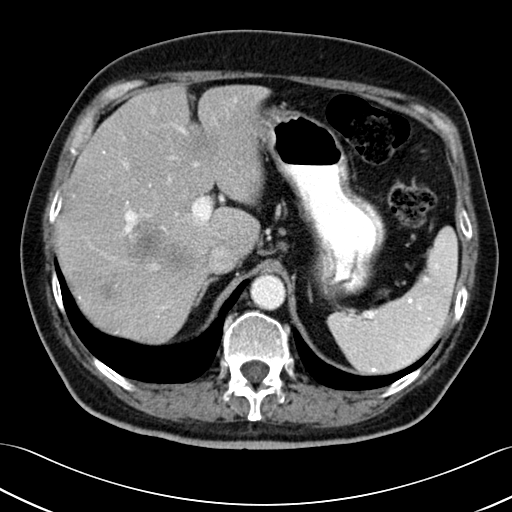
[im 44/94  mediastinal]
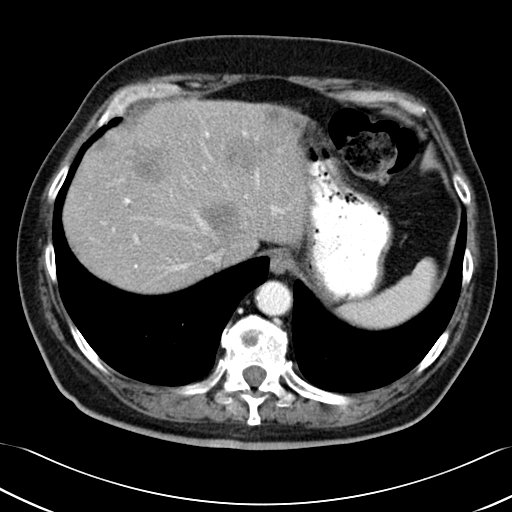
[im 50/94  mediastinal]
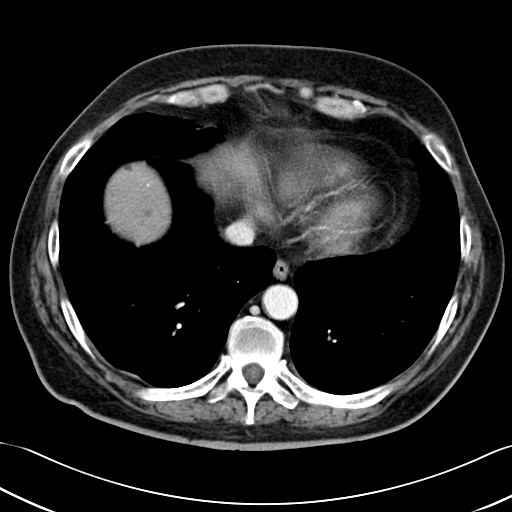
[im 56/94  mediastinal]
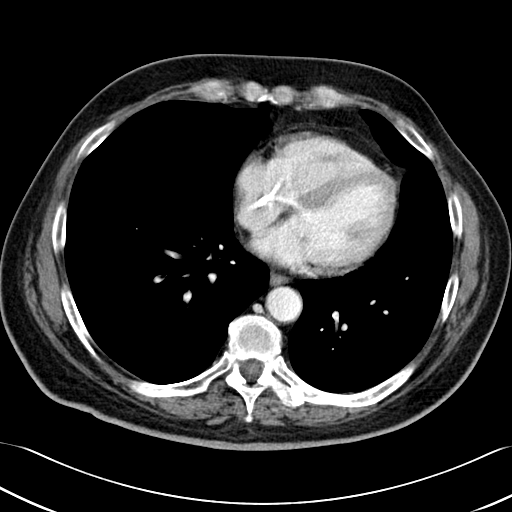
[im 63/94  mediastinal]
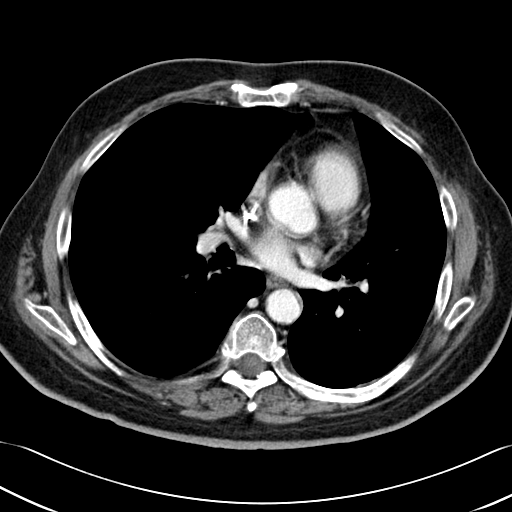
[im 63/94  bone]
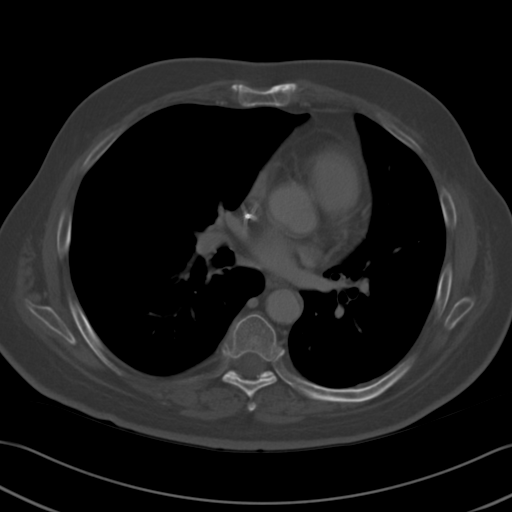
[im 69/94  mediastinal]
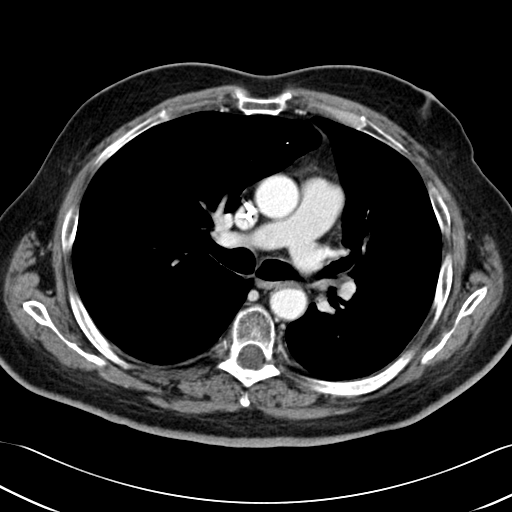
[im 69/94  lung]
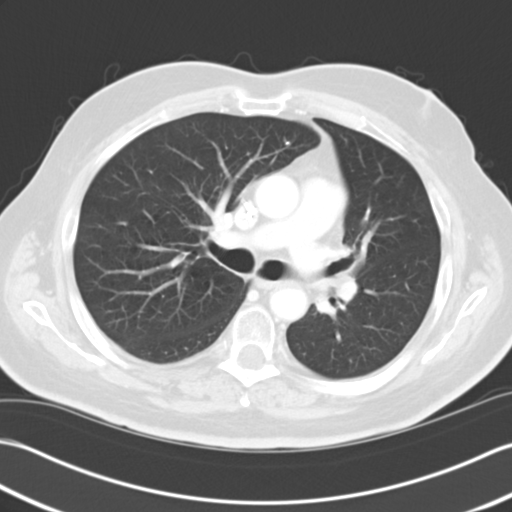
[im 75/94  lung]
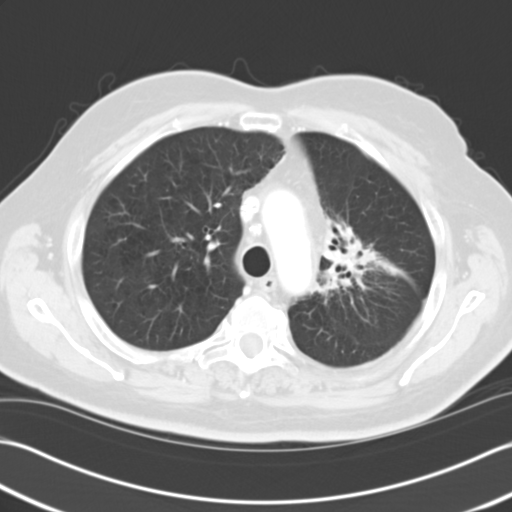
[im 81/94  mediastinal]
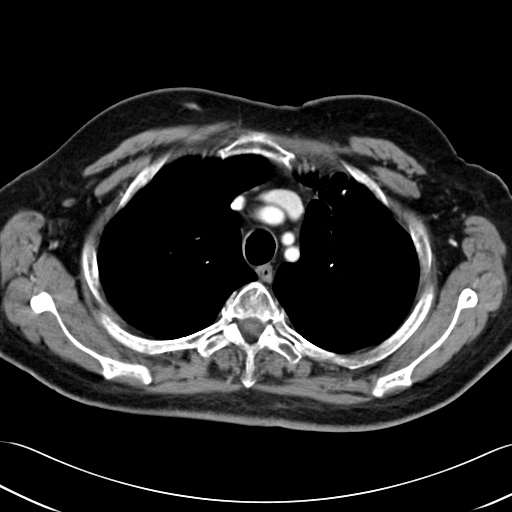
[im 81/94  lung]
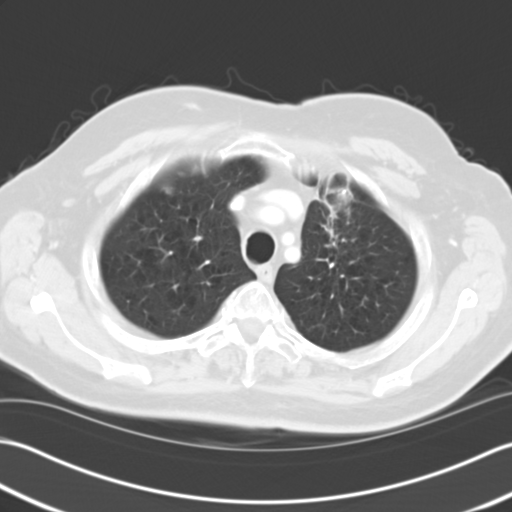
[im 87/94  mediastinal]
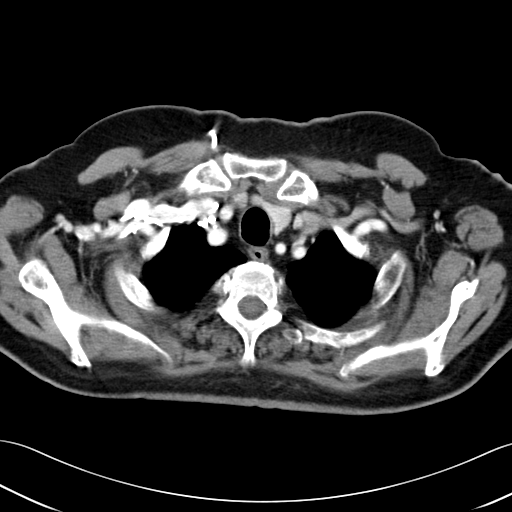
[im 87/94  lung]
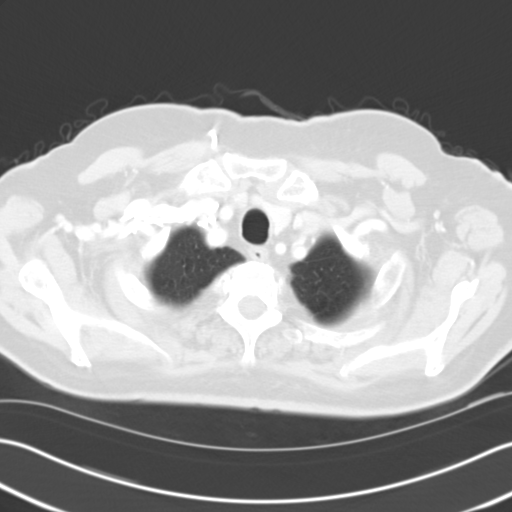

[13 of 29 positions shown; findings below may reference images not displayed]

FINDINGS: There is perihilar increased density extending into the left upper
lobe anterior paramediastinal region without a discrete mass being evident.
A discrete soft tissue mass is not evident. The changes may be secondary to
radiation therapy fibrosis. No new pulmonary parenchymal mass is evident.
Emphysematous lung disease is seen bilaterally especially in the apical
regions. There is no pleural or pericardial effusion. No mediastinal or
hilar mass is evident. The heart is normal in size. The thoracic aorta is
normal. Port-A-Cath device is present over the right chest. The tip of the
catheter is in the superior vena cava extending into the right atrium and
appears to be in the right ventricle.

Images involving the abdomen show extensive areas of abnormal attenuation
and enhancement within the right and left lobes of the liver consistent with
metastatic disease. There appear to be no significant changes in the
appearance of the multiple hepatic lesions. The largest area in the left
lobe of the liver superiorly measures approximately 2.42 cm anterior to
posterior x 2.92 cm medial to lateral on image #50 just to the right of
midline. There is a large cyst posteriorly from the left kidney. There is a
medial to lateral dimension of 7.3 cm with an anterior to posterior
dimension of 6.6 cm. The lower pole stone seen previously in the left kidney
appears to be in the proximal left ureter on images #79 and 80. No
significant obstructive change is appreciated. Atherosclerotic changes are
noted in the aorta. There is no evidence of abnormal bowel distention or
wall thickening. The spleen contains some granulomatous calcification
without a focal mass. The adrenal glands and kidneys are otherwise
unremarkable. The pancreas is within normal limits. The gallbladder is
decompressed. The bony structures show some degenerative spurring.
IMPRESSION: 1.  Presumed post radiation fibrosis in the left lung. No definite
measurable mass appreciated.
2.  Multiple hepatic metastatic foci without significant change.
3.  Previous lower pole left renal calculus appears to be within the
proximal left ureter near the ureteropelvic junction. No significant
hydronephrosis evident. Urologic follow-up may be beneficial. Stable left
renal cyst.

## 2014-07-20 NOTE — H&P (Signed)
PATIENT NAME:  Kevin Sawyer, Kevin Sawyer MR#:  [id number] DATE OF BIRTH:  08-Nov-1940  DATE OF ADMISSION:  12/03/2011  PRIMARY CARE PHYSICIAN: Dr. Ellsworth Lennox  REFERRING PHYSICIAN: Dr. Brien Mates   CHIEF COMPLAINT: Declining condition for one week, fever three days.   HISTORY OF PRESENT ILLNESS: 74 year old African American male with a history of hypertension, hyperlipidemia, lung cancer diagnosed 22 months ago status post chemotherapy and radiation presented to the ED with above chief complaint. Patient is awake, alert, oriented but a little bit confused, lethargic. According to him and his wife patient was diagnosed with lung cancer 22 months ago and underwent chemotherapy and radiation therapy in Cancer Center here and then Dr. Doylene Canning suggested patient go to [facility] to get new chemotherapy medication treatment so patient underwent chemotherapy with new medication infusion twice at [facility] cancer center two weeks ago then patient was noted to have a fever, leukocytosis and admitted to [facility]. He was treated with antibiotics but no source of infection was found. He was discharged home one week ago, however, his condition has progressively declined after discharge. He has fatigue, headache, fever, chills for the past three days. In addition, he complains of abdominal pain which is constant, radiation to the back. In addition, patient complains of bilateral leg pain, unable to walk. Patient's white count is 20 in ED, treated with Levaquin   PAST MEDICAL HISTORY:  1. Hypertension. 2. Hyperlipidemia. 3. Lung cancer.   PAST SURGICAL HISTORY: Tonsillectomy.   SOCIAL HISTORY: Smoked for about 40 years, quit smoking in 2001. No alcohol, drug abuse.   FAMILY HISTORY: Mother had MI.   ALLERGIES: Contrast iodine dye.   MEDICATIONS: 1. Fluconazole 100 mg p.o. daily. 2. Lasix 40 mg p.o. daily.  3. Klor-Con 20 tablets 1 tablet p.o. daily.  4. Mirtazapine 50 mg p.o. at bedtime.  5. Oxycodone 5 mg p.o. every four hours.   6. OxyContin 20 mg extended release q.12 hours.    REVIEW OF SYSTEMS: CONSTITUTIONAL: Has fever, chills, headache, dizziness and generalized weakness. EYES: No double vision, blurred vision. ENT: No postnasal drip, epistaxis, slurred speech or dysphagia, CARDIOVASCULAR: No chest pain, palpitation, orthopnea, nocturnal dyspnea. No leg edema. PULMONARY: No cough, sputum, shortness of breath, or hematemesis. GASTROINTESTINAL: Positive for chronic abdominal pain, nausea and but no vomiting or diarrhea. No melena or bloody stool. GENITOURINARY: No dysuria or hematuria or incontinence. SKIN: No rash or jaundice. HEMATOLOGY: No easy bruising, bleeding. ENDOCRINE: No polyuria, polydipsia, heat or cold intolerance. NEUROLOGY: No syncope, loss of consciousness or seizure.   PHYSICAL EXAMINATION:  VITAL SIGNS: Temperature 100, blood pressure 107/58, pulse 130, oxygen saturation 97%, respirations 18.   GENERAL: Patient is alert, awake, oriented but a little bit confused in no acute distress.   HEENT: Pupils round, equal, reactive to light, accommodation. Dry oral mucosa with very right tongue. Clear oropharynx.   NECK: Supple. No JVD or carotid bruit. No lymphadenopathy. No thyromegaly.     CARDIOVASCULAR: S1, S2 regular rate, rhythm. No murmurs, gallops.   PULMONARY: Bilateral air entry. No wheezing, rales. No use of accessory muscles to breathe.   ABDOMEN: Soft, diffuse tenderness. No rebound. No rigidity. Bowel sounds present. No organomegaly.   EXTREMITIES: No edema, clubbing, or cyanosis. No calf tenderness but has increased sensitivity to pain stimuli. Strong bilateral pedal pulses.   NEUROLOGY: Alert and oriented x3. No focal deficits. Power 5/5. Sensation intact.   LABORATORY, DIAGNOSTIC, AND RADIOLOGICAL DATA: Urinalysis: RBC 6, WBC 6. CBC showed WBC 20.3, hemoglobin 9.3, platelets  191, glucose 133, BUN 8, creatinine 0.84, sodium 138, potassium 3.2, chloride 101, bicarbonate 31, bilirubin total  7.9, alkaline phosphatase 618, SGOT 26, SGPT 76, albumin 2.1, lipase 48, magnesium 1.6, phosphate 2.6. EKG shows sinus tachycardia with 126 beats per minute. CAT scan of head showed no acute intracranial abnormality. Chest x-ray no infiltrate but status post radiation changes. [facility] showed no evidence of biliary obstruction or progression of disease.   IMPRESSION:  1. Systemic inflammatory response syndrome.  2. Leukocytosis.  3. Hypokalemia.  4. Hypomagnesemia.  5. Anemia.  6. Lung cancer with metastasis.  7. Hypertension.  8. Hyperlipidemia  9. Elevated bilirubin.   PLAN OF TREATMENT:  1. Patient will be admitted to medical floor. Will start vancomycin and Zosyn IVPB. Follow up CBC, blood culture. Will get ID consult.  2. Continue oxycodone and morphine p.r.n. for pain control.  3. Will get oncology and palliative care consult and get physical therapy evaluation.  4. Will give potassium, magnesium supplement and follow up level.  5. GI and deep vein thrombosis prophylaxis.   Discussed the patient's situation and plan of treatment with patient and patient's wife. Also discussed patient's CODE STATUS with patient and patient's wife. They want FULL CODE.    TIME SPENT: About 72 minutes.  ____________________________ Shaune PollackQing Jacarra Bobak, MD qc:cms D: 12/03/2011 21:01:01 ET T: 12/04/2011 06:39:40 ET  JOB#: [id number] cc: Marland McalpineSheikh A. Ellsworth Lennoxejan-Sie, MD Shaune PollackQING Judas Mohammad MD ELECTRONICALLY SIGNED 12/04/2011 16:26

## 2014-07-20 NOTE — Consult Note (Signed)
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    HPI   patient was admitted in the hospital with progressive declining condition.  Was on an experimental chemotherapy with monoclonal antibody as patient is 3 or more   lines  of chemotherapy 4 metastatic somewhat cell carcinoma of lung.  Patient's condition is rapidly declined.  Has recently been hospitalized at [facility] for fever.  Blood cultures and workup was negative .  patient  was admitted in the  [facility] with progressive declining condition.patient is poorly responsive.and has altered mental status.    Has  rising bilirubin  PFSH:   Family History noncontributory    Comments No family history of colorectal cancer, breast cancer, or ovarian cancer.  History of hypertension.    Social History negative tobacco    Comments Patient has smoked for 43 years more than one pack per day, quit smoking 11 years ago, does not drink.  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[place] [employer]    Additional Past Medical and Surgical History Hypertension Hyperlipidemia   Review of Systems:   General weakness  pain  fatigue    Performance Status (ECOG) 3    Lungs cough  SOB    GI nausea/vomiting    GU no complaints    Musculoskeletal back pain    Extremities swelling    Skin no complaints    Neuro Altered mental status    Endocrine no complaints    Psych no complaints   NURSING NOTES: **Vital Signs.:   03-Sep-13 01:06    Vital Signs Type: Q 4hr    Temperature Temperature (F): 98.5    Celsius: 36.9    Temperature Source: Oral    Pulse Pulse: 120    Respirations Respirations: 20    Systolic BP Systolic BP: 854    Diastolic BP (mmHg) Diastolic BP (mmHg): 57    Mean BP: 74    Pulse Ox % Pulse Ox %: 93    Pulse Ox Activity Level: At rest    Oxygen Delivery: 2L   Physical Exam:   General Patient is poorly responsive.    HEENT: normal    Lungs: rales  crepitations     Abdomen: soft  tender    Skin: intact    Extremities: edema    Neuro: disoriented    Physical Exam Patient has altered mental status.  Poorly responsive.  No other localizing sign     Lung/Liver Cancer:    Hypertension:    Kidney Stones:    wheezing:    lung mass:    Lung Biopsy and port a cath insertion: 22-Feb-2010   Tonsillectomy:    Contrast - Iodinated Radiocontrast Dye: Hives    fluconazole 100 mg oral tablet: 1 tab(s) orally once a day, Active, 0, None   OxyContin 20 mg oral tablet, extended release: 1 tab(s) orally every 12 hours, Active, 0, None   oxycodone 5 mg oral tablet: 1 tab(s) orally every 4 hours, Active, 0, None   Klor-Con M20 oral tablet, extended release: 1 tab(s) orally once a day, Active, 0, None   furosemide 40 mg oral tablet: 1 tab(s) orally once a day, Active, 0, None   mirtazapine 15 mg oral tablet: 1 tab(s) orally once a day (at bedtime), Active, 0, None  Laboratory Results: Routine Chem:  03-Sep-13 02:45    Glucose, Serum  102   BUN 7   Creatinine (comp) 0.61  Sodium, Serum 140   Potassium, Serum  3.1   Chloride, Serum 104   CO2, Serum 30   Calcium (Total), Serum 8.7   Anion Gap  6   Osmolality (calc) 278   eGFR (African American) >60   eGFR (Non-African American) >60 (eGFR values <45m/min/1.73 m2 may be an indication of chronic kidney disease (CKD). Calculated eGFR is useful in patients with stable renal function. The eGFR calculation will not be reliable in acutely ill patients when serum creatinine is changing rapidly. It is not useful in  patients on dialysis. The eGFR calculation may not be applicable to patients at the low and high extremes of body sizes, pregnant women, and vegetarians.)   Magnesium, Serum 1.8 (1.8-2.4 THERAPEUTIC RANGE: 4-7 mg/dL TOXIC: > 10 mg/dL  -----------------------)  Routine Hem:  03-Sep-13 02:45    WBC (CBC)  15.5   RBC (CBC)  2.80   Hemoglobin (CBC)  8.7   Hematocrit (CBC)  25.0    Platelet Count (CBC) 157   MCV 89   MCH 31.0   MCHC 34.7   RDW  17.4   Neutrophil % 81.0   Lymphocyte % 10.0   Monocyte % 6.9   Eosinophil % 1.5   Basophil % 0.6   Neutrophil #  12.5   Lymphocyte # 1.6   Monocyte #  1.1   Eosinophil # 0.2   Basophil # 0.1 (Result(s) reported on 04 Dec 2011 at 03:15AM.)   Assessment and Plan:  Impression:   1.syndrome of lung metastatic disease to liver progressing disease.  Patient has failed multiple chemotherapy program.  I had detailed discussion with Dr. [facility]Aniceto Bossat [facility] is not a candidate for continuing chemotherapy because of declining performance status and rising bilirubin.  I discussed that the family. Fever is most likely due to   carcinoma of lung metastatic disease to liver.  [facility].  I discussed situation with Dr. blocker from infectious disease department and we agreed that there is no need for any infectious disease consultation at present time patient would not benefit from continuing antibiotic therapyhad detailed discussion with family regarding overall poor prognosis.  Patient's wife is agreeable for no code  and no further aggressive treatment. get hospice evaluation the possibility of either hospice at home or hospice home.  Patient has very limited life expectancy  in terms of days . tube feeding is not indicated in this situation discussed that with the family. We will proceed with comfort care orders..   Wife  agrees that goal of treatment should be comfort.   Electronic Signatures: CJobe Gibbon(MD)  (Signed 03-Sep-13 13:07)  Authored: HISTORY OF PRESENT ILLNESS, PFSH, ROS, NURSING NOTES, PE, PAST MEDICAL HISTORY, ALLERGIES, HOME MEDICATIONS, LABS, ASSESSMENT AND PLAN   Last Updated: 03-Sep-13 13:07 by CJobe Gibbon(MD)

## 2014-07-20 NOTE — Discharge Summary (Signed)
PATIENT NAME:  Kevin Sawyer, Kevin Sawyer MR#:  [id number] DATE OF BIRTH:  [birth year]  DATE OF ADMISSION:  12/03/2011 DATE OF DISCHARGE:  12/06/2011  DISCHARGE DIAGNOSIS: Metastatic stage IV lung cancer.   DISCHARGE MEDICATIONS: 1. Roxicodone. 2. [employer] [place] mouthwash. 3. Nystatin swish and swallow 5 mL p.r.n.  4. Fentanyl patch.  5. Tylenol suppositories   HOSPITAL COURSE: This gentleman was admitted to the Emergency Room with fevers following a course of chemotherapy from the Cancer Center. He has metastatic lung cancer with mets to his liver. After discussion with oncology service he was deemed to have extremely poor prognosis and the family agreed to make him comfort care. [name] analgesia was controlled with fentanyl patch narcotic analgesia. He did have oral flush which was managed with oral nystatin. He was discharged to home with hospice with full comfort care measures.   DISCHARGE PROCESS TIME SPENT: 35 minutes.  ____________________________ Silas FloodSheikh A. Ellsworth Lennoxejan-Sie, MD sat:cms D: 12/21/2011 09:21:32 ET T: 12/21/2011 10:23:43 ET JOB#: [id number]  cc: Sheikh A. Ellsworth Lennoxejan-Sie, MD, <Dictator>  Charlesetta GaribaldiSHEIKH A TEJAN-SIE MD ELECTRONICALLY SIGNED 01/01/2012 13:29
